# Patient Record
Sex: Male | Born: 2000 | Race: White | Hispanic: No | Marital: Single
Health system: Southern US, Community
[De-identification: ages and names within clinical notes are randomized; demographics above are authoritative.]

---
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
## 2021-01-03 ENCOUNTER — Other Ambulatory Visit: Payer: Self-pay | Admitting: Family Medicine

## 2021-01-03 DIAGNOSIS — N5089 Other specified disorders of the male genital organs: Secondary | ICD-10-CM

## 2021-01-15 ENCOUNTER — Other Ambulatory Visit: Payer: Self-pay

## 2021-01-15 ENCOUNTER — Ambulatory Visit
Admission: RE | Admit: 2021-01-15 | Discharge: 2021-01-15 | Disposition: A | Discharge status: Home / Self Care | Source: Ambulatory Visit | Attending: Family Medicine | Admitting: Family Medicine

## 2021-01-15 DIAGNOSIS — N5089 Other specified disorders of the male genital organs: Secondary | ICD-10-CM | POA: Diagnosis present

## 2021-12-28 IMAGING — CR DG CHEST 2V
1 series · 3 of 3 positions shown · non-contrast
Comparison: None.

CLINICAL DATA: Pt comes POV with rapid heart rate per pt. Pt feels
like his heart is beating very fastly and there is fluttering in his
chest.

EXAM:
CHEST - 2 VIEW

[Series 1: dg chest 2 view · 0.14mm/px · 3 of 3 slices shown]
[im 1/3]
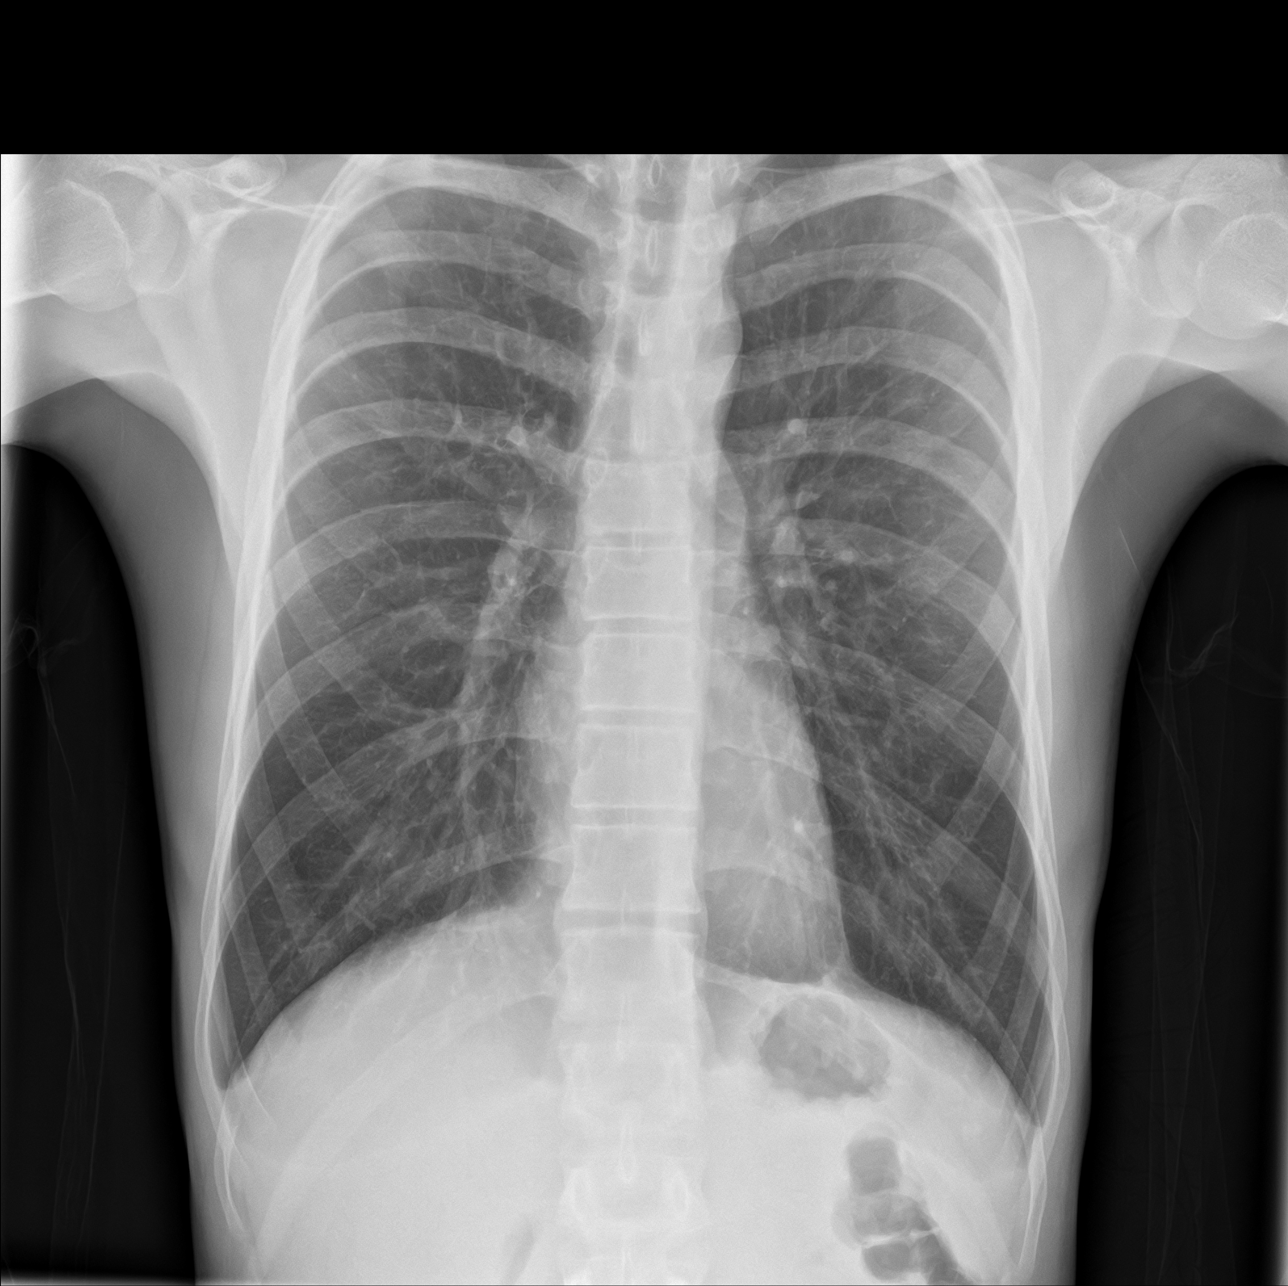
[im 2/3]
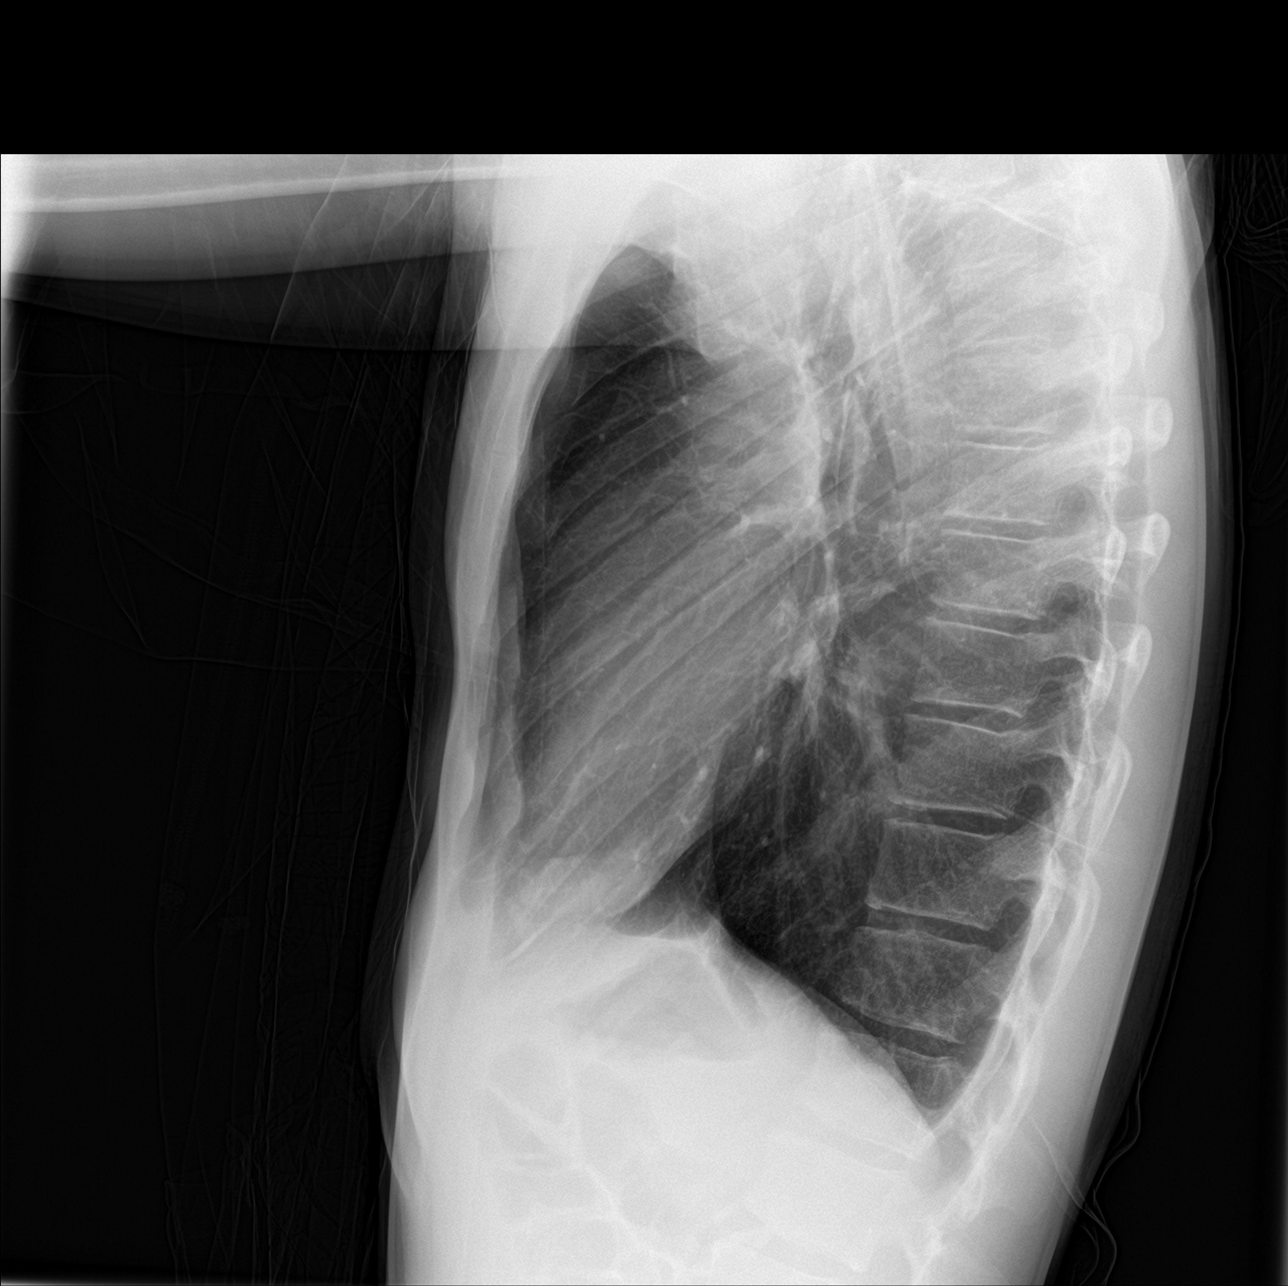
[im 3/3]
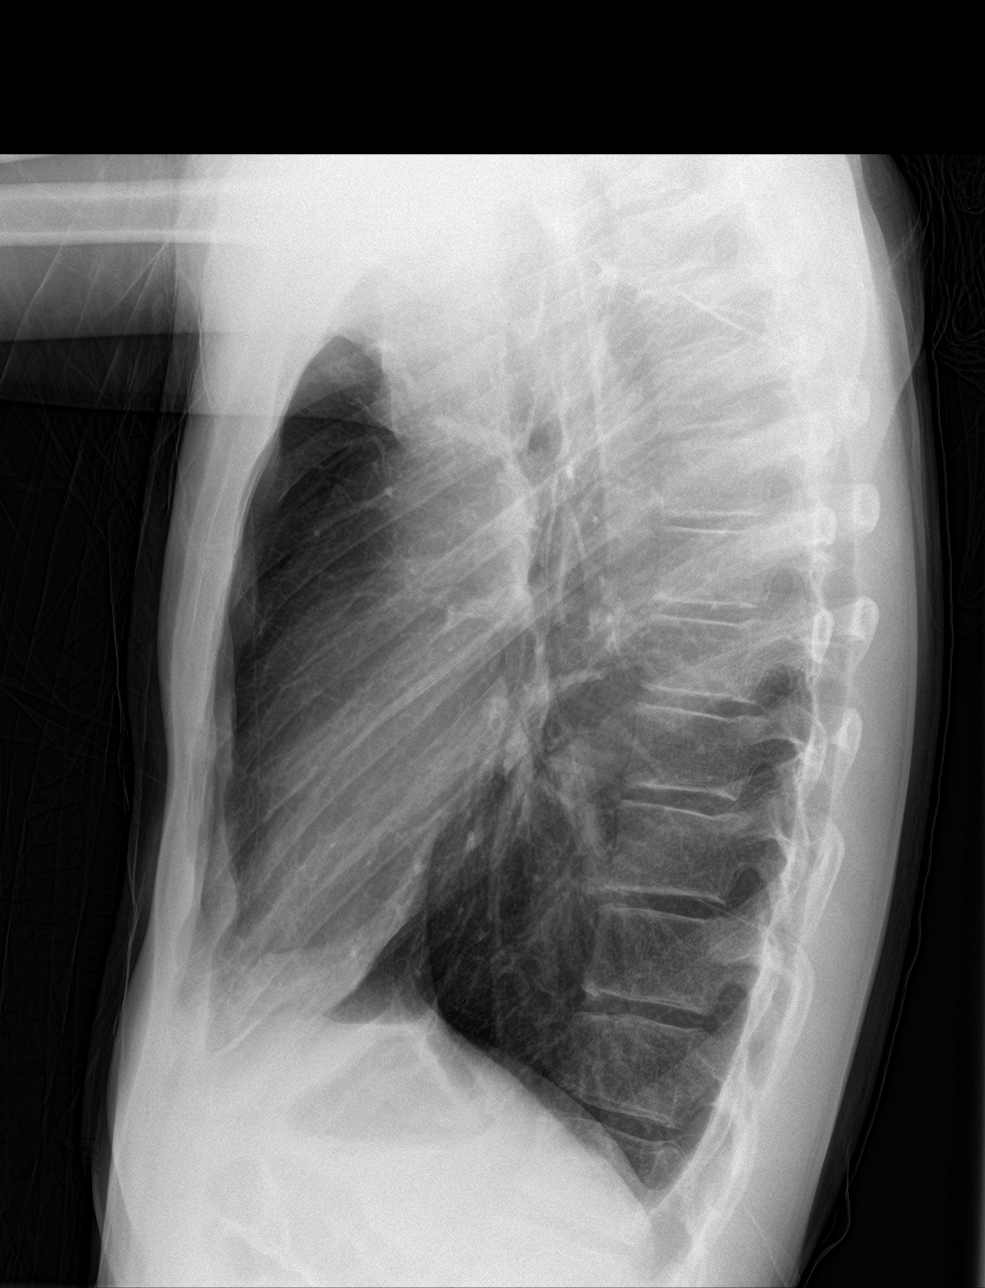

[3 of 3 positions shown; findings below may reference images not displayed]

FINDINGS: The heart size and mediastinal contours are within normal limits.
Both lungs are clear. No pneumothorax or pleural effusion. The
visualized skeletal structures are unremarkable.
IMPRESSION: No acute cardiopulmonary finding.

## 2022-09-22 IMAGING — US US SOFT TISSUE HEAD/NECK
1 series · 10 of 10 positions shown · non-contrast
Comparison: None.

CLINICAL DATA: Palpable abnormality involving the right-side of the
neck.

EXAM:
ULTRASOUND OF HEAD/NECK SOFT TISSUES
TECHNIQUE: Ultrasound examination of the head and neck soft tissues was
performed in the area of clinical concern.

[Series 1: us soft tissue head/neck · 0.07mm/px · 10 acquisitions, 10 frames shown]
[im 1/10]
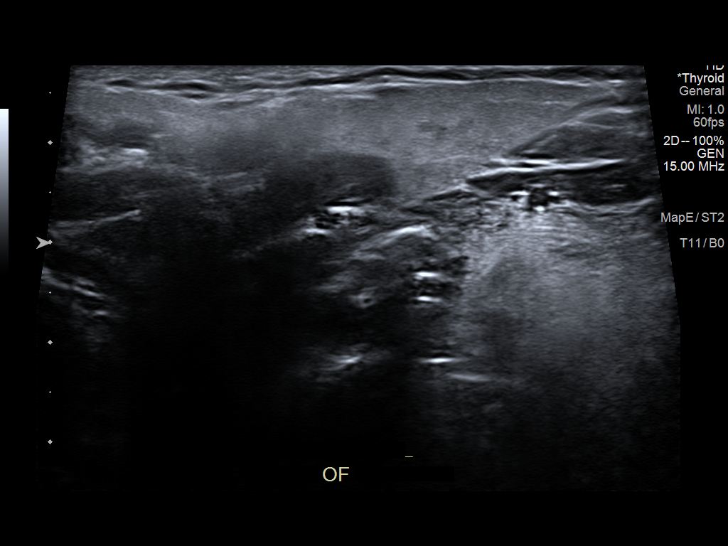
[im 2/10]
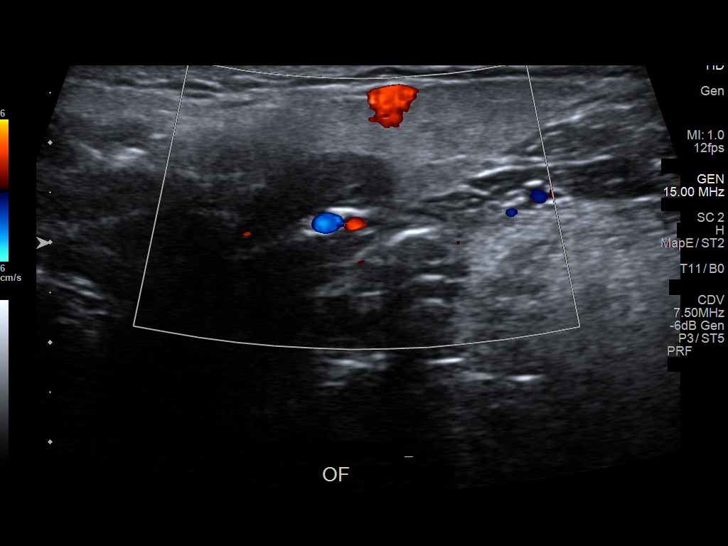
[im 3/10]
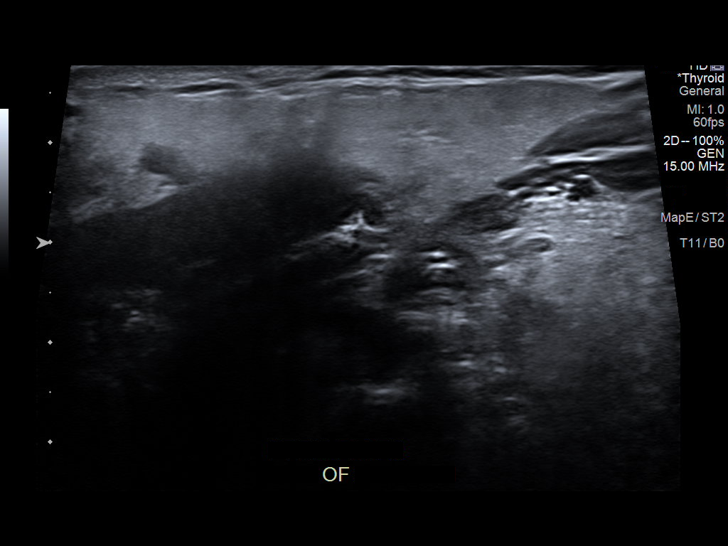
[im 4/10]
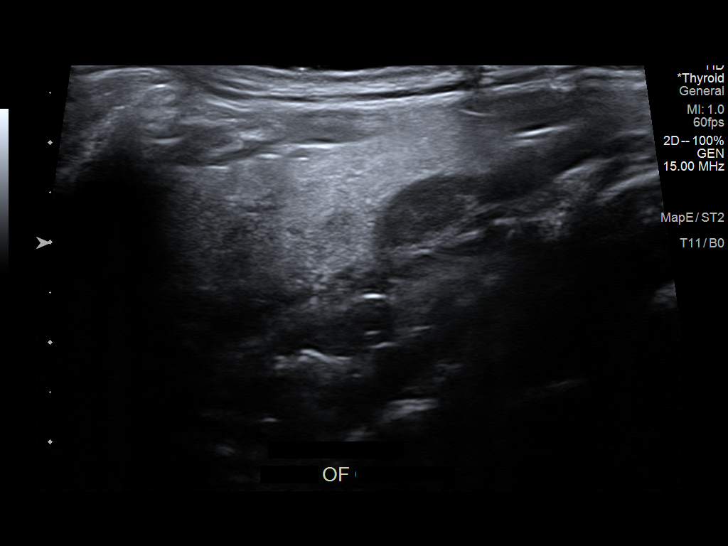
[im 5/10]
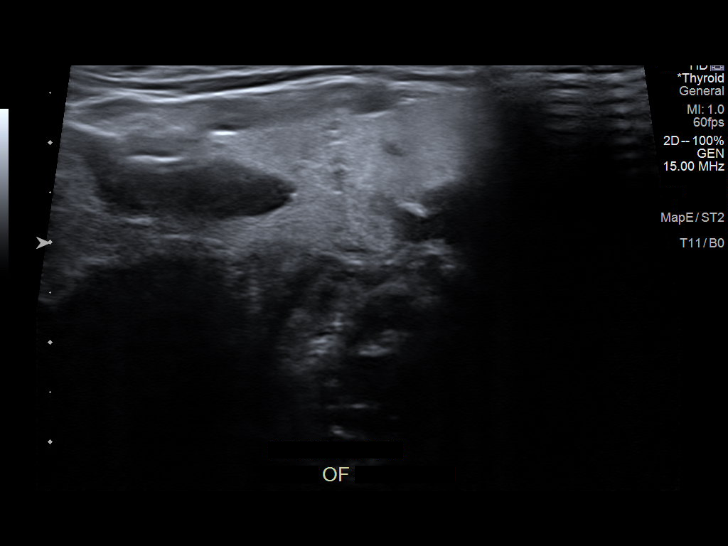
[im 6/10]
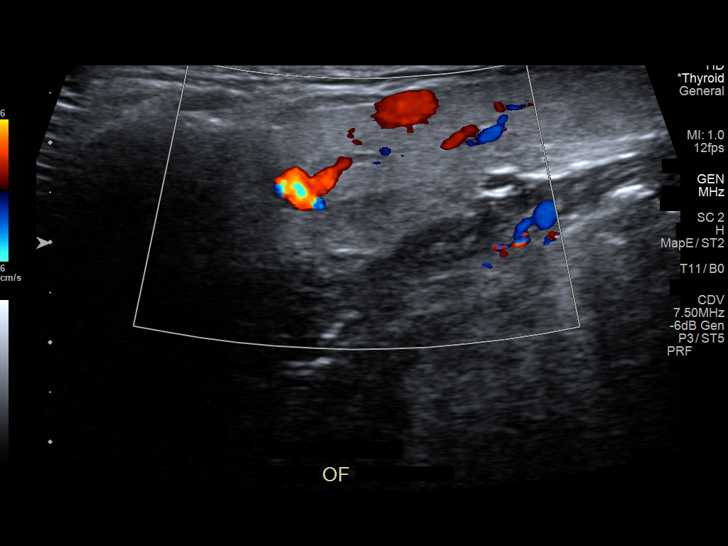
[im 7/10]
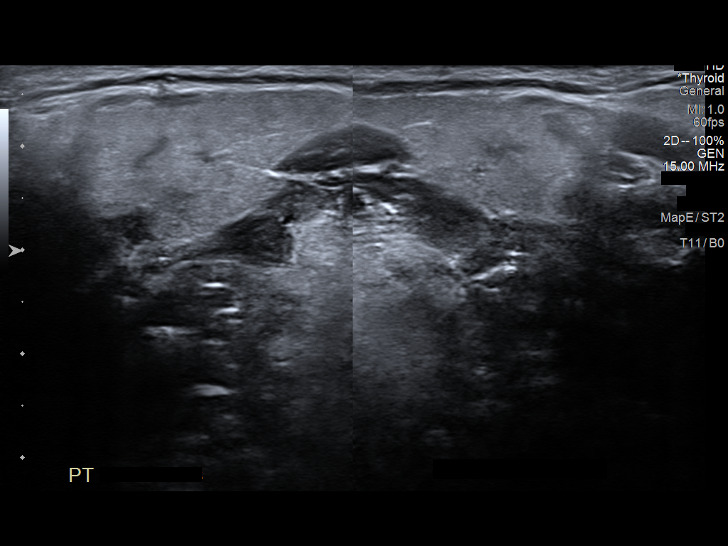
[im 8/10]
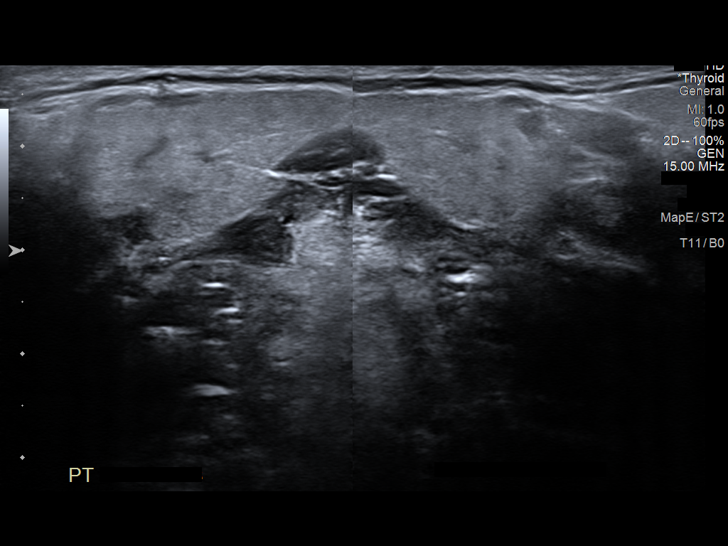
[im 9/10]
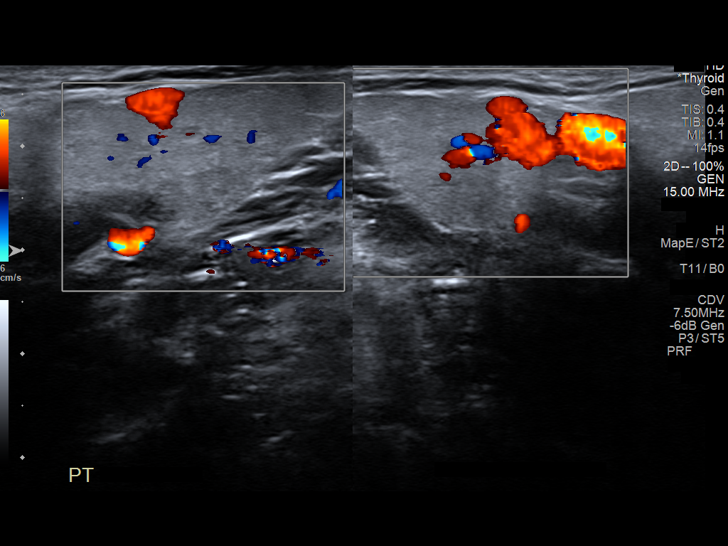
[im 10/10]
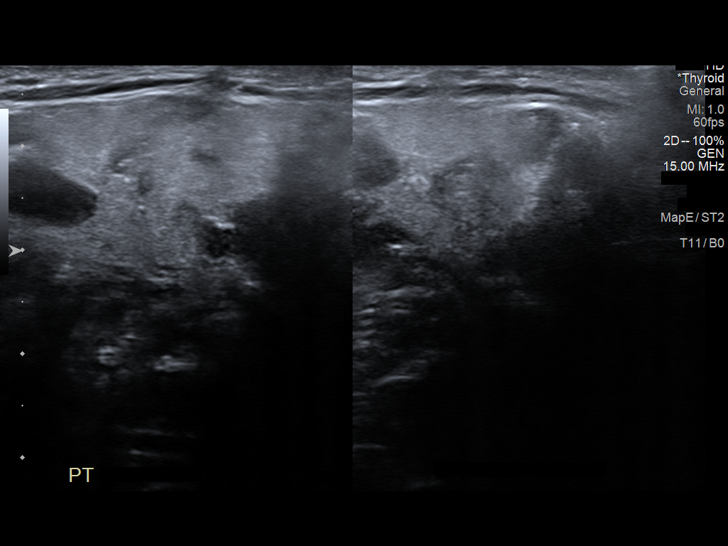

[10 of 10 positions shown; findings below may reference images not displayed]

FINDINGS: Sonographic evaluation the patient's palpable area of concern
involving the right-side of the neck appears to correlate with a
normal appearing portion of the submandibular gland.

No definitive intraglandular nodules are identified. No definitive
intraductal dilatation.

Otherwise, there is no definitive sonographic correlate for
patient's palpable area of concern. Specifically, no discrete solid
or cystic lesions. No regional cervical lymphadenopathy.
IMPRESSION: Patient's palpable area of concern involving the right-side of the
neck appears to correlate with a portion of a normal appearing
submandibular gland.

Otherwise, there is no definitive sonographic correlate for
patient's palpable area of concern.

## 2023-01-05 IMAGING — US US SCROTUM W/ DOPPLER COMPLETE
1 series · 14 of 25 positions shown · non-contrast
Comparison: None.

CLINICAL DATA: Scrotal mass right side

EXAM:
SCROTAL ULTRASOUND
DOPPLER ULTRASOUND OF THE TESTICLES
TECHNIQUE: Complete ultrasound examination of the testicles, epididymis, and
other scrotal structures was performed. Color and spectral Doppler
ultrasound were also utilized to evaluate blood flow to the
testicles.

[Series 1: us scrotum w/doppler · 14 of 55 slices shown]
[im 1/55]
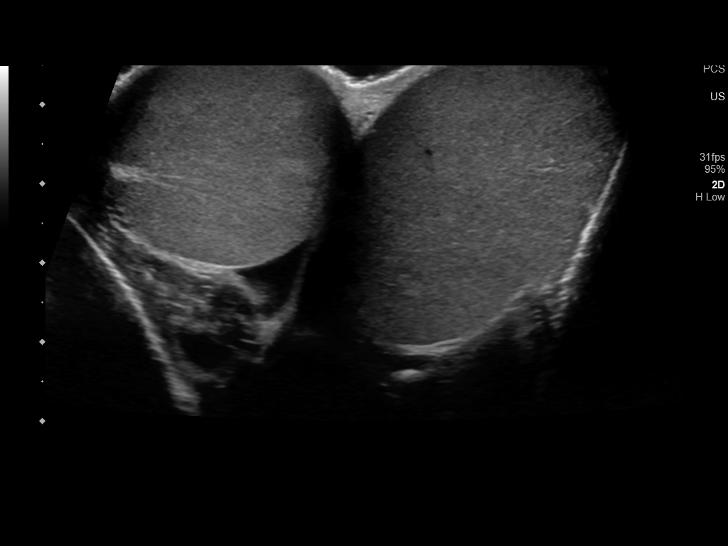
[im 5/55]
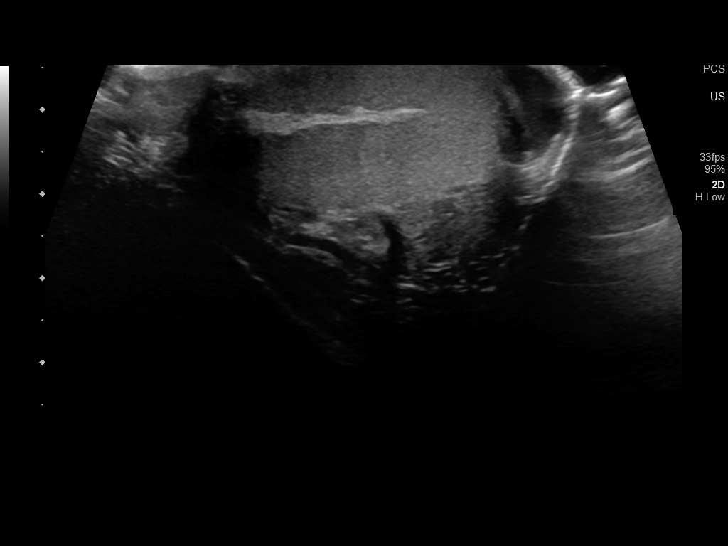
[im 10/55]
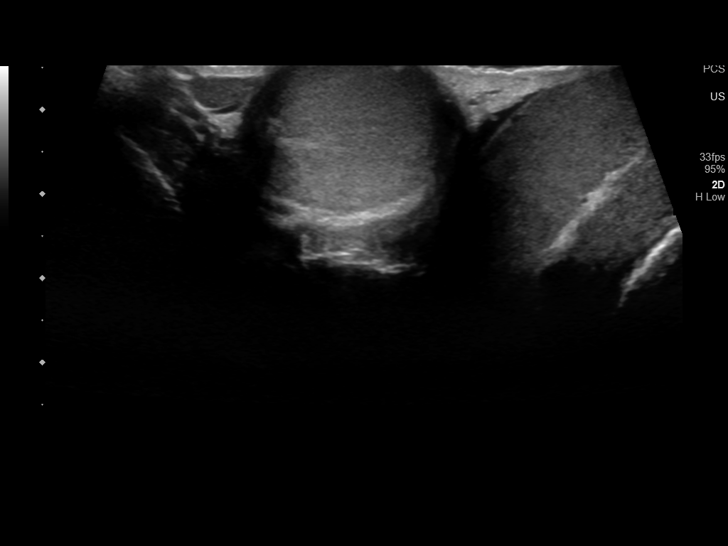
[im 14/55]
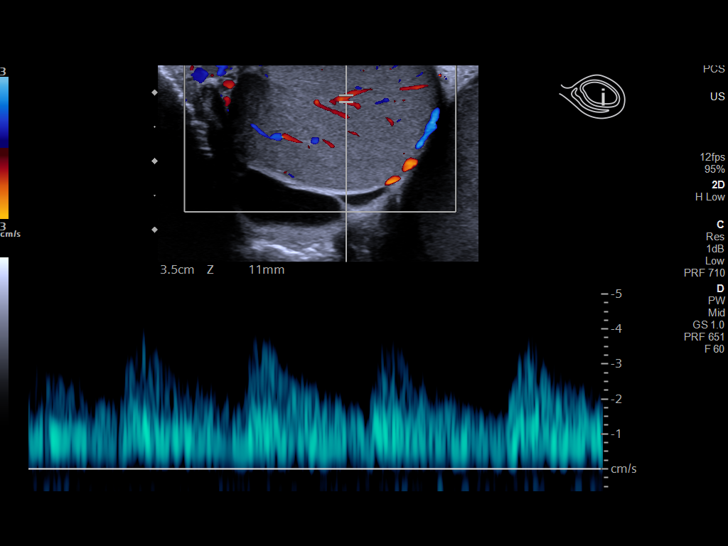
[im 19/55]
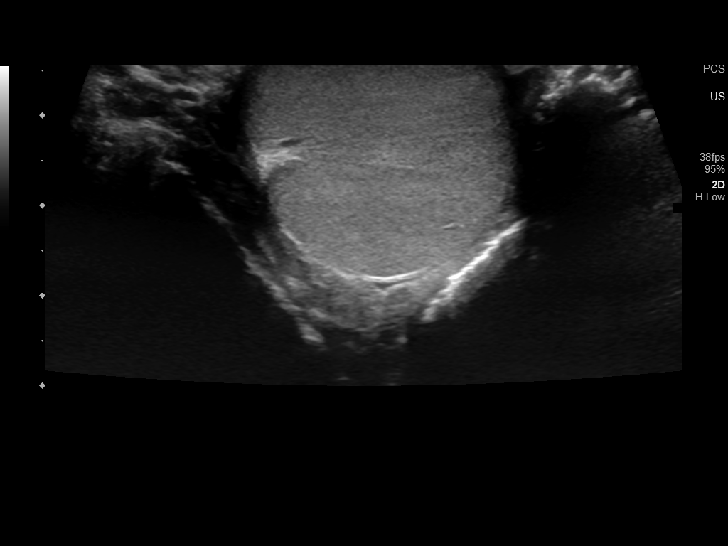
[im 21/55]
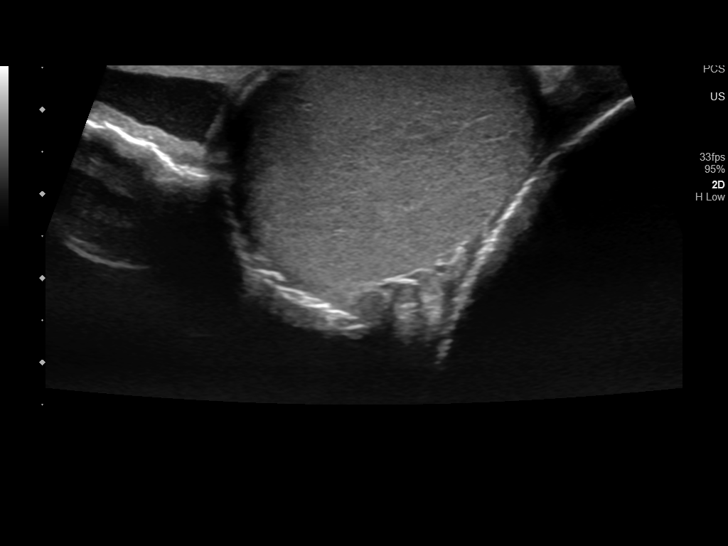
[im 25/55]
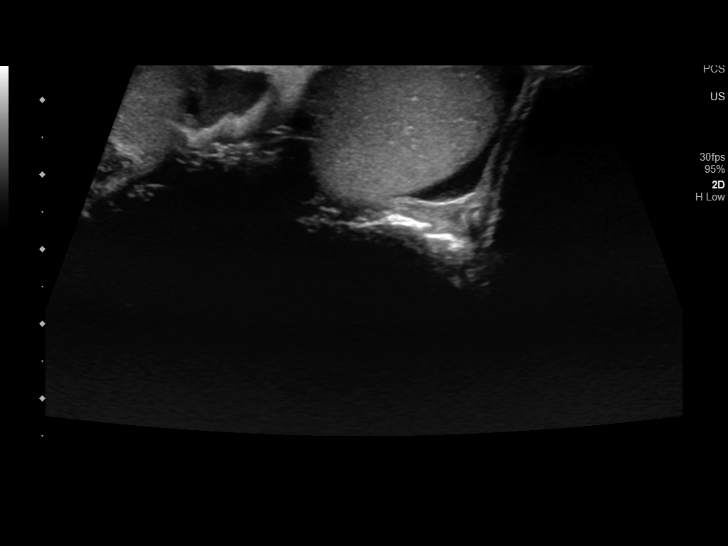
[im 30/55]
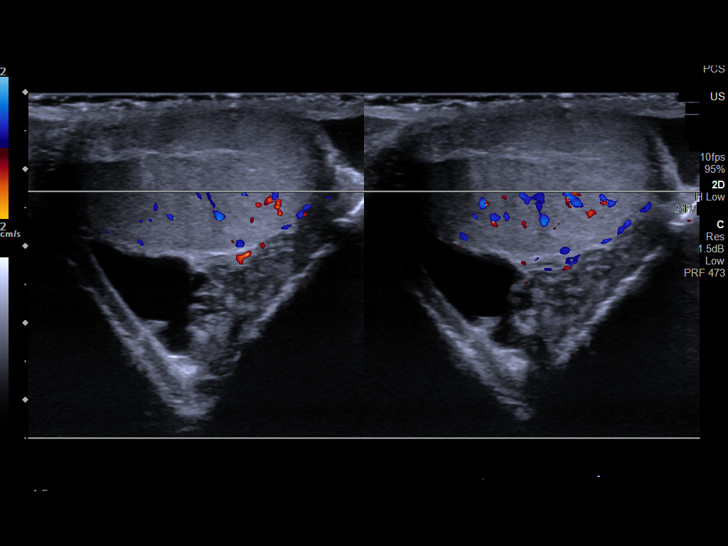
[im 34/55]
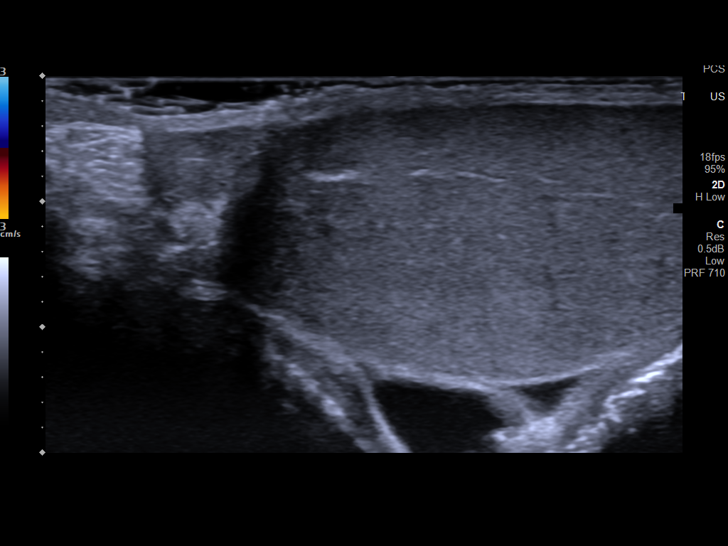
[im 37/55]
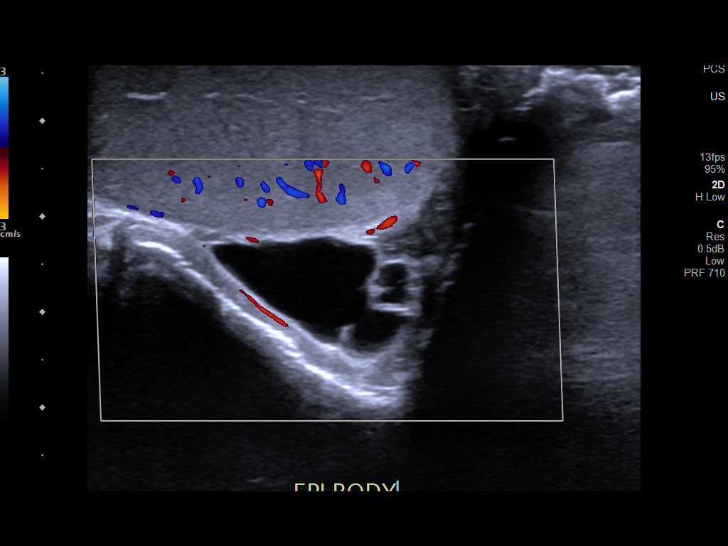
[im 41/55]
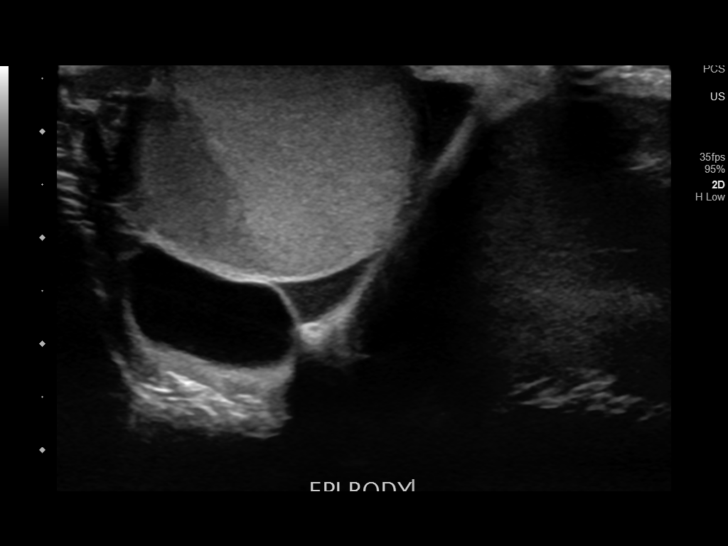
[im 46/55]
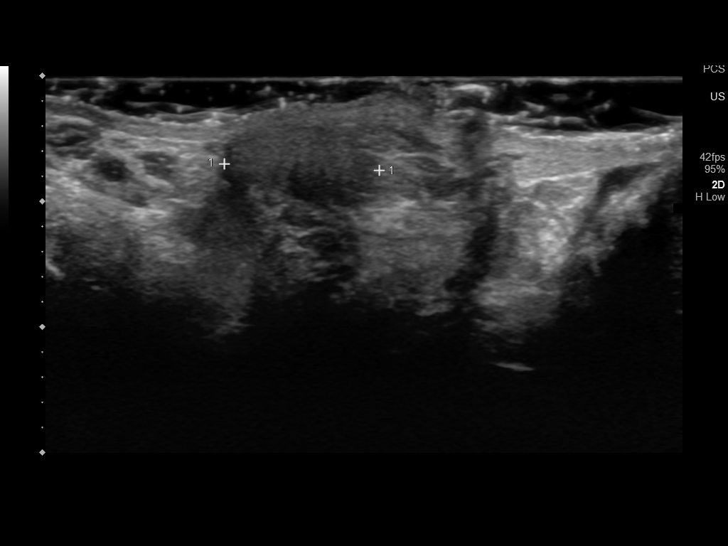
[im 50/55]
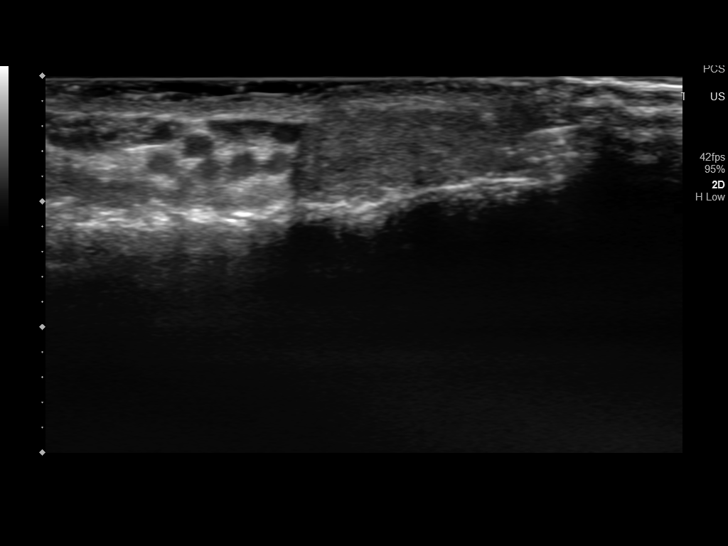
[im 55/55]
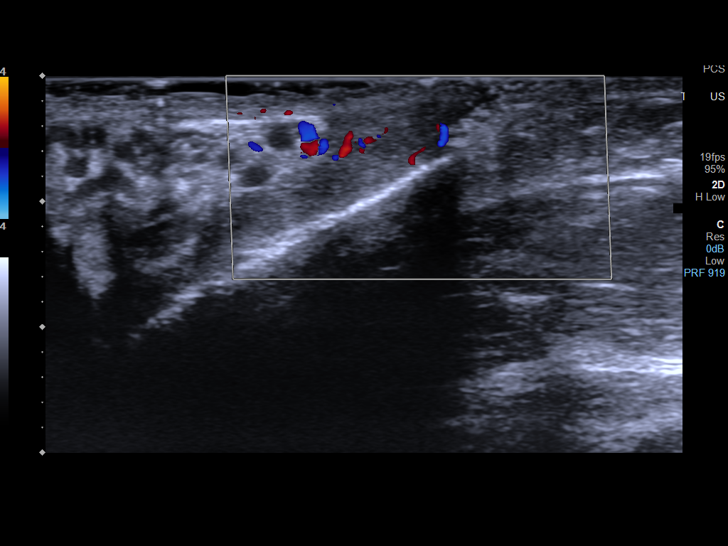

[14 of 25 positions shown; findings below may reference images not displayed]

FINDINGS: Right testicle

Measurements: 4.7 x 2.6 x 2.8 cm. No mass or microlithiasis
visualized.

Left testicle

Measurements: 4.6 x 2.8 x 3.6 cm. No mass or microlithiasis
visualized.

Right epididymis: There is a right epididymal cyst measuring 2.4 x
1.0 x 1.6 cm, with some visible septations.

Left epididymis:  Normal in size and appearance.

Hydrocele:  Small bilateral hydroceles.

Varicocele:  None visualized.

Pulsed Doppler interrogation of both testes demonstrates normal low
resistance arterial and venous waveforms bilaterally.
IMPRESSION: Partially septated right epididymal cyst measuring 2.4 x 1.0 x
cm.

Normal sonographic appearance and Doppler evaluation of the
testicles.

## 2024-05-19 ENCOUNTER — Encounter: Payer: Self-pay | Admitting: Dermatology

## 2024-05-19 ENCOUNTER — Ambulatory Visit: Admitting: Dermatology

## 2024-05-19 DIAGNOSIS — H00011 Hordeolum externum right upper eyelid: Secondary | ICD-10-CM | POA: Diagnosis not present

## 2024-05-19 DIAGNOSIS — L988 Other specified disorders of the skin and subcutaneous tissue: Secondary | ICD-10-CM

## 2024-05-19 NOTE — Patient Instructions (Signed)

## 2024-05-19 NOTE — Progress Notes (Signed)
   New Patient Visit   Subjective  Kurt Leon is a 23 y.o. male who presents for the following: Molluscum? Scrotum, started ~32yrs ago and slowly spread over the years, irritating, itchy prn, check R eyelid hx of stye, drained ~4m ago  New patient referral from Dr. Cheryl Chol.  The following portions of the chart were reviewed this encounter and updated as appropriate: medications, allergies, medical history  Review of Systems:  No other skin or systemic complaints except as noted in HPI or Assessment and Plan.  Objective  Well appearing patient in no apparent distress; mood and affect are within normal limits.   A focused examination was performed of the following areas: groin  Relevant exam findings are noted in the Assessment and Plan.    Assessment & Plan   Prominent sebaceous glands (fordyce spots) vs APOCRINE GLANDS Scrotum and penis Exam: scattered subQ micro papules evenly space and scattered on penile shaft and scrotum  Treatment Plan: Benign, normal variant, observe.    RESOLVING STYE R upper eyelid Exam: erythema and scaling of R upper eyelid margin  Treatment Plan: Recommend warm compress qhs Discussed Doxycycline pt declines  FORDYCE SPOTS OF PENIS   HORDEOLUM EXTERNUM OF RIGHT UPPER EYELID    Return if symptoms worsen or fail to improve.  I, Grayce Saunas, RMA, am acting as scribe for Boneta Sharps, MD .   Documentation: I have reviewed the above documentation for accuracy and completeness, and I agree with the above.  Boneta Sharps, MD
